# Patient Record
Sex: Female | Born: 1975 | Race: White | Hispanic: No | Marital: Married
Health system: Southern US, Community
[De-identification: ages and names within clinical notes are randomized; demographics above are authoritative.]

---

## 2013-10-05 ENCOUNTER — Emergency Department (HOSPITAL_BASED_OUTPATIENT_CLINIC_OR_DEPARTMENT_OTHER): Payer: BC Managed Care – PPO

## 2013-10-05 ENCOUNTER — Emergency Department (HOSPITAL_BASED_OUTPATIENT_CLINIC_OR_DEPARTMENT_OTHER)
Admission: EM | Admit: 2013-10-05 | Discharge: 2013-10-05 | Disposition: A | Discharge status: Home / Self Care | Payer: BC Managed Care – PPO | Attending: Emergency Medicine | Admitting: Emergency Medicine

## 2013-10-05 DIAGNOSIS — R0789 Other chest pain: Secondary | ICD-10-CM | POA: Insufficient documentation

## 2013-10-05 DIAGNOSIS — I10 Essential (primary) hypertension: Secondary | ICD-10-CM | POA: Insufficient documentation

## 2013-10-05 DIAGNOSIS — R002 Palpitations: Secondary | ICD-10-CM | POA: Insufficient documentation

## 2013-10-05 DIAGNOSIS — R079 Chest pain, unspecified: Secondary | ICD-10-CM

## 2013-10-05 LAB — CBC WITH DIFFERENTIAL/PLATELET
BASOS ABS: 0 10*3/uL (ref 0.0–0.1)
BASOS PCT: 1 % (ref 0–1)
Eosinophils Absolute: 0.2 10*3/uL (ref 0.0–0.7)
Eosinophils Relative: 2 % (ref 0–5)
HCT: 41.9 % (ref 36.0–46.0)
HEMOGLOBIN: 14.1 g/dL (ref 12.0–15.0)
Lymphocytes Relative: 30 % (ref 12–46)
Lymphs Abs: 2.6 10*3/uL (ref 0.7–4.0)
MCH: 29.9 pg (ref 26.0–34.0)
MCHC: 33.7 g/dL (ref 30.0–36.0)
MCV: 89 fL (ref 78.0–100.0)
MONOS PCT: 9 % (ref 3–12)
Monocytes Absolute: 0.8 10*3/uL (ref 0.1–1.0)
NEUTROS ABS: 5 10*3/uL (ref 1.7–7.7)
Neutrophils Relative %: 58 % (ref 43–77)
Platelets: 250 10*3/uL (ref 150–400)
RBC: 4.71 MIL/uL (ref 3.87–5.11)
RDW: 13.6 % (ref 11.5–15.5)
WBC: 8.5 10*3/uL (ref 4.0–10.5)

## 2013-10-05 LAB — COMPREHENSIVE METABOLIC PANEL
ALBUMIN: 3.7 g/dL (ref 3.5–5.2)
ALK PHOS: 65 U/L (ref 39–117)
ALT: 16 U/L (ref 0–35)
AST: 16 U/L (ref 0–37)
BUN: 11 mg/dL (ref 6–23)
CHLORIDE: 104 meq/L (ref 96–112)
CO2: 26 mEq/L (ref 19–32)
Calcium: 8.9 mg/dL (ref 8.4–10.5)
Creatinine, Ser: 0.8 mg/dL (ref 0.50–1.10)
GFR calc Af Amer: >90 mL/min (ref >90)
GFR calc non Af Amer: >90 mL/min (ref >90)
Glucose, Bld: 85 mg/dL (ref 70–99)
Potassium: 4 mEq/L (ref 3.7–5.3)
SODIUM: 141 meq/L (ref 137–147)
TOTAL PROTEIN: 7 g/dL (ref 6.0–8.3)
Total Bilirubin: 0.3 mg/dL (ref 0.3–1.2)

## 2013-10-05 LAB — LIPASE, BLOOD: Lipase: 27 U/L (ref 11–59)

## 2013-10-05 LAB — D-DIMER, QUANTITATIVE (NOT AT ARMC): D DIMER QUANT: 0.32 ug{FEU}/mL (ref 0.00–0.48)

## 2013-10-05 LAB — TROPONIN I: Troponin I: <0.3 ng/mL (ref <0.30)

## 2013-10-05 NOTE — ED Notes (Signed)
She states she had an episode of tightness in her left chest and rapid heart beat last night. After she calmed down and her heart rate was back to normal she took her BP and it was 180/129. Today she feels tired and like something is just not right.

## 2013-10-05 NOTE — ED Provider Notes (Signed)
CSN: [id number]     Arrival date & time 10/05/13  1046 History   First MD Initiated Contact with Patient 10/05/13 1142     Chief Complaint  Patient presents with  . Hypertension     (Consider location/radiation/quality/duration/timing/severity/associated sxs/prior Treatment) Patient is a 38 y.o. female presenting with hypertension. The history is provided by the patient.  Hypertension Associated symptoms include chest pain and shortness of breath. Pertinent negatives include no abdominal pain and no headaches.   patient with onset of rapid heart rate chest discomfort lasted 20 minutes. Occurred last night. No symptoms since. Patient checked her blood pressure at home and she got 180/129. Patient without history of hypertension. With the palpitation to light with rapid heart rate. The chest pain was in the substernal area it was a tightness feeling nonradiating. As stated associated with shortness of breath no nausea no vomiting. No history of similar problems. No known cardiac history. Past medical history is noncontributory. Patient completely asymptomatic now.  No past medical history on file. No past surgical history on file. No family history on file. History  Substance Use Topics  . Smoking status: Not on file  . Smokeless tobacco: Not on file  . Alcohol Use: Not on file   OB History   No data available     Review of Systems  Constitutional: Negative for fever and fatigue.  HENT: Negative for congestion.   Eyes: Negative for redness.  Respiratory: Positive for shortness of breath.   Cardiovascular: Positive for chest pain and palpitations.  Gastrointestinal: Negative for abdominal pain.  Genitourinary: Negative for dysuria.  Musculoskeletal: Negative for back pain.  Neurological: Negative for headaches.  Hematological: Does not bruise/bleed easily.  Psychiatric/Behavioral: Negative for confusion.      Allergies  Review of patient's allergies indicates no known  allergies.  Home Medications  No current outpatient prescriptions on file. BP 128/97  Pulse 66  Temp(Src) 98.7 F (37.1 C) (Oral)  Resp 18  Ht 5\' 8"  (1.727 m)  Wt 300 lb (136.079 kg)  BMI 45.63 kg/m2  SpO2 100%  LMP 09/20/2013 Physical Exam  Nursing note and vitals reviewed. Constitutional: She is oriented to person, place, and time. She appears well-developed and well-nourished. No distress.  HENT:  Head: Normocephalic and atraumatic.  Mouth/Throat: Oropharynx is clear and moist.  Eyes: Conjunctivae and EOM are normal. Pupils are equal, round, and reactive to light.  Neck: Normal range of motion.  Cardiovascular: Normal rate, regular rhythm and intact distal pulses.   Pulmonary/Chest: Effort normal and breath sounds normal. No respiratory distress.  Abdominal: Soft. Bowel sounds are normal. There is no tenderness.  Musculoskeletal: Normal range of motion. She exhibits no edema.  Neurological: She is alert and oriented to person, place, and time. No cranial nerve deficit. She exhibits normal muscle tone. Coordination normal.  Skin: No rash noted.    ED Course  Procedures (including critical care time) Labs Review Labs Reviewed  D-DIMER, QUANTITATIVE  CBC WITH DIFFERENTIAL  COMPREHENSIVE METABOLIC PANEL  LIPASE, BLOOD  TROPONIN I   Imaging Review Dg Chest 2 View  10/05/2013   CLINICAL DATA:  Hypertension and chest tightness  EXAM: CHEST  2 VIEW  COMPARISON:  None.  FINDINGS: Lungs are clear. Heart size and pulmonary vascularity are normal. No adenopathy. No pneumothorax. No bone lesions.  IMPRESSION: No abnormality noted.   Electronically Signed   By: Bretta BangWilliam  Woodruff M.D.   On: 10/05/2013 12:35    EKG Interpretation    Date/Time:  Tuesday October 05 2013 11:03:35 EST Ventricular Rate:  75 PR Interval:  158 QRS Duration: 86 QT Interval:  386 QTC Calculation: 431 R Axis:   7 Text Interpretation:  Normal sinus rhythm Cannot rule out Anterior infarct , age  undetermined Abnormal ECG No previous ECGs available Confirmed by Jamile Rekowski  MD, Lucrezia Dehne ([id number]) on 10/05/2013 11:45:58 AM            MDM   Final diagnoses:  Heart palpitations  Chest pain    Patient's workup here is negative. EKG without any acute cardiac findings. Troponin was negative. Patient's period of chest pain was last night the troponin being negative rules out acute cardiac event. The pain lasted for 20 minutes. Palpitations for 20 minutes 2. Patient's blood pressure here is been fine. No evidence of hypertension. Patient will be referred to cardiology for consideration for additional workup for the palpitations and the chest pain. We'll start on aspirin. Today's workup also negative for pneumonia pneumothorax pulmonary edema. D-dimer is negative not consistent with pulmonary embolus. And troponin was negative along with an EKG without acute findings. Patient's pain completely asymptomatic here.    Shelda Jakes, MD 10/05/13 [phone]

## 2013-10-05 NOTE — Discharge Instructions (Signed)
Aspirin and Your Heart Aspirin affects the way your blood clots and helps "thin" the blood. Aspirin has many uses in heart disease. It may be used as a primary prevention to help reduce the risk of heart related events. It also can be used as a secondary measure to prevent more heart attacks or to prevent additional damage from blood clots.  ASPIRIN MAY HELP IF YOU:  Have had a heart attack or chest pain.  Have undergone open heart surgery such as CABG (Coronary Artery Bypass Surgery).  Have had coronary angioplasty with or without stents.  Have experienced a stroke or TIA (transient ischemic attack).  Have peripheral vascular disease (PAD).  Have chronic heart rhythm problems such as atrial fibrillation.  Are at risk for heart disease. BEFORE STARTING ASPIRIN Before you start taking aspirin, your caregiver will need to review your medical history. Many things will need to be taken into consideration, such as:  Smoking status.  Blood pressure.  Diabetes.  Gender.  Weight.  Cholesterol level. ASPIRIN DOSES  Aspirin should only be taken on the advice of your caregiver. Talk to your caregiver about how much aspirin you should take. Aspirin comes in different doses such as:  81 mg.  162 mg.  325 mg.  The aspirin dose you take may be affected by many factors, some of which include:  Your current medications, especially if your are taking blood-thinners or anti-platelet medicine.  Liver function.  Heart disease risk.  Age.  Aspirin comes in two forms:  Non-enteric-coated. This type of aspirin does not have a coating and is absorbed faster. Non-enteric coated aspirin is recommended for patients experiencing chest pain symptoms. This type of aspirin also comes in a chewable form.  Enteric-coated. This means the aspirin has a special coating that releases the medicine very slowly. Enteric-coated aspirin causes less stomach upset. This type of aspirin should not be chewed  or crushed. ASPIRIN SIDE EFFECTS Daily use of aspirin can increase your risk of serious side effects. Some of these include:  Increased bleeding. This can range from a cut that does not stop bleeding to more serious problems such as stomach bleeding or bleeding into the brain (Intracerebral bleeding).  Increased bruising.  Stomach upset.  An allergic reaction such as red, itchy skin.  Increased risk of bleeding when combined with non-steroidal anti-inflammatory medicine (NSAIDS).  Alcohol should be drank in moderation when taking aspirin. Alcohol can increase the risk of stomach bleeding when taken with aspirin.  Aspirin should not be given to children less than 29 years of age due to the association of Reye syndrome. Reye syndrome is a serious illness that can affect the brain and liver. Studies have linked Reye syndrome with aspirin use in children.  People that have nasal polyps have an increased risk of developing an aspirin allergy. SEEK MEDICAL CARE IF:   You develop an allergic reaction such as:  Hives.  Itchy skin.  Swelling of the lips, tongue or face.  You develop stomach pain.  You have unusual bleeding or bruising.  You have ringing in your ears. SEEK IMMEDIATE MEDICAL CARE IF:   You have severe chest pain, especially if the pain is crushing or pressure-like and spreads to the arms, back, neck, or jaw. THIS IS AN EMERGENCY. Do not wait to see if the pain will go away. Get medical help at once. Call your local emergency services (911 in the U.S.). DO NOT drive yourself to the hospital.  You have stroke-like symptoms  such as:  Loss of vision.  Difficulty talking.  Numbness or weakness on one side of your body.  Numbness or weakness in your arm or leg.  Not thinking clearly or feeling confused.  Your bowel movements are bloody, dark red or black in color.  You vomit or cough up blood.  You have blood in your urine.  You have shortness of breath,  coughing or wheezing. MAKE SURE YOU:   Understand these instructions.  Will monitor your condition.  Seek immediate medical care if necessary. Document Released: 07/18/2008 Document Revised: 11/30/2012 Document Reviewed: 07/18/2008 ExitCare Patient Information 2014 Marion Downer.    call cardiology for followup. Number is [phone].  Start a baby aspirin a day. Return for recurrent rapid heart rate lasting 40 minutes or longer. Return for any new or worse chest pain. Resource guide provided below to help you find a regular doctor.    Emergency Department Resource Guide 1) Find a Doctor and Pay Out of Pocket Although you won't have to find out who is covered by your insurance plan, it is a good idea to ask around and get recommendations. You will then need to call the office and see if the doctor you have chosen will accept you as a new patient and what types of options they offer for patients who are self-pay. Some doctors offer discounts or will set up payment plans for their patients who do not have insurance, but you will need to ask so you aren't surprised when you get to your appointment.  2) Contact Your Local Health Department Not all health departments have doctors that can see patients for sick visits, but many do, so it is worth a call to see if yours does. If you don't know where your local health department is, you can check in your phone book. The CDC also has a tool to help you locate your state's health department, and many state websites also have listings of all of their local health departments.  3) Find a Walk-in Clinic If your illness is not likely to be very severe or complicated, you may want to try a walk in clinic. These are popping up all over the country in pharmacies, drugstores, and shopping centers. They're usually staffed by nurse practitioners or physician assistants that have been trained to treat common illnesses and complaints. They're usually fairly quick  and inexpensive. However, if you have serious medical issues or chronic medical problems, these are probably not your best option.  No Primary Care Doctor: - Call Health Connect at  [phone] - they can help you locate a primary care doctor that  accepts your insurance, provides certain services, etc. - Physician Referral Service- [phone]  Chronic Pain Problems: Organization         Address  Phone   Notes  [facility]  [phone] Patients need to be referred by their primary care doctor.   Medication Assistance: Organization         Address  Phone   Notes  [facility] Medication [facility] [address] [place], [place] [address] [phone] --Must be a resident of [facility] -- Must have NO insurance coverage whatsoever (no Medicaid/ Medicare, etc.) -- The pt. MUST have a primary care doctor that directs their care regularly and follows them in the community   Med[facility]ist  [phone]   [employer]  [phone]    Agencies that provide inexpensive medical care: Organization  Address  Phone   Notes  [place]  [phone]   [facility]    [phone]   [facility] [place], [place] [address] [phone]   [place] [address] [place]. [address], [place] [phone]   Planned Parenthood    [phone]   [facility]    [phone]   [place] [address], [place] Phone:  [phone], Fax:  [phone] Hours of Operation:  9 am - 6 pm, M-F.  Also accepts Medicaid/Medicare and self-pay.  [facility] for [place] [address], [address], [place] Phone: [phone], Fax: [phone]. Hours of Operation:  8:30 am - 5:30 pm, M-F.  Also accepts Medicaid and self-pay.  [facility] [place] [address] [place] Phone: [phone]    [place], [place], [place] [phone], Ext. [phone] Mondays & Thursdays: 7-9 AM.  First 15 patients are seen on a first come, first serve basis.    [place] Providers:  Organization         Address  Phone   Notes  [facility] [address], [place] [phone] Also accepts self-pay patients.  [facility] [address], [place]  [phone]   [place], [address], [place] [phone]   [facility] [address] [place] [phone]   Lucianne Lei [address], [place]   [phone] Only accepts [place] Access [place] patients after they have their name applied to their card.   Self-Pay (no insurance) in [facility]:  Organization         Address  Phone   Notes  Sickle Cell Patients, [facility] [place] [phone]   [facility] [place] [phone]   [facility] [place]  [place], [place], [place] [phone]   [facility]/Dr. Osei-Bonsu  [address] [place], [place] or [address], [place] [phone] Phone number for both [place] and [place] locations is the same.  [facility] [address] [place] [phone]   [facility] [address], [place] or [address] [phone]   [facility] [address], [place] [phone], phone; [phone], fax Sees patients 1st and 3rd Saturday of every month.  Must not qualify for public or private insurance (i.e. Medicaid, Medicare, [place] Health Choice, Veterans' Benefits)  Household income should be no more than 200% of the poverty level The clinic cannot treat you if you are  pregnant or think you are pregnant  Sexually transmitted diseases are not treated at the clinic.    Dental Care: Organization         Address  Phone  Notes  [facility] Department of [facility] [place] [phone] Accepts children up to age 41 who are enrolled in [place] or [place]; pregnant women with a Medicaid card; and children who have applied for Medicaid or [place] Health Choice, but were declined, whose parents can pay a reduced fee at time  of service.  [facility] Department of [facility]  [address], [place] [phone] Accepts children up to age 26 who are enrolled in [place] or [place]; pregnant women with a Medicaid card; and children who have applied for Medicaid or [place] Health Choice, but were declined, whose parents can pay a reduced fee at time of service.  [facility]  [place] [phone] Patients are seen by appointment only. Walk-ins are not accepted. [place] will see patients 53 years of age and older. Monday - Tuesday (8am-5pm) Most Wednesdays (8:30-5pm) $30 per visit, cash only  [facility] Adult Dental Access PROGRAM  [address], [facility] [phone] Patients are seen by appointment only. Walk-ins are not accepted. [place] will see patients 87 years of age and older. One Wednesday Evening (Monthly: Volunteer Based).  $30 per visit, cash only  [place]  [phone] for adults; Children under age 29, call Graduate Pediatric Dentistry at [phone]. Children aged 22-14, please call [phone] to request a pediatric application.  Dental services are provided in all areas of dental care including fillings, crowns and bridges, complete and partial dentures, implants, gum treatment, root canals, and extractions. Preventive care is also provided. Treatment is provided to  both adults and children. Patients are selected via a lottery and there is often a waiting list.   [facility] [address] [place]  [phone] [email or web address]   [facility] [address] [place], [place] [phone], Ext. [phone] Second and Fourth Thursday of each month, opens at 6:30 AM; Clinic ends at 9 AM.  Patients are seen on a first-come first-served basis, and a limited number are seen during each clinic.   [facility]  [address] Hillard Danker [place], [place] [phone]   Eligibility Requirements You must have lived in [place], [place], or [place] counties for at least the last three months.   You cannot be eligible for state or federal sponsored [employer], including [employer], [place], or [employer].   You generally cannot be eligible for healthcare insurance through your employer.    How to apply: Eligibility screenings are held every Tuesday and Wednesday afternoon from 1:00 pm until 4:00 pm. You do not need an appointment for the interview!  [facility] [address] [place], [place]   [place]  [place] Department  [place]  [phone]    Behavioral Health Resources in the Community: Intensive Outpatient Programs Organization         Address  Phone  Notes  [place]. [address] [place], [place] [phone]   [facility] Outpatient [address] [place], [place]   ADS: Alcohol & Drug Svcs [address], [place], [place]   [place] [address],  [place], [place] or [phone]   Substance Abuse Resources Organization         Address  Phone  Notes  Alcohol and Drug Services  [phone]   [place]  [phone]   [facility] [place]   Chinita Pester  [phone]   Residential & Outpatient Substance Abuse Program  [phone]   Psychological Services Organization         Address  Phone  Notes  [facility]  [phone]- [phone]   [facility]  [phone]- [phone]   [facility] [address], [place] [phone] or [phone]    Mobile Crisis Teams Organization         Address  Phone  Notes  [facility], Mobile Crisis Care Unit  [phone]   [facility]  [address] [place], [place] [phone]   Doristine LocksSharon DeEsch [address] [place] [phone]    Self-Help/Support Groups Organization         Address  Phone             Notes  [facility] of [place] - variety of support groups  336- [phone] Call for more information  Narcotics Anonymous (NA), [facility] [address], [place]  2 meetings at this location   Statisticianesidential Treatment Programs Organization         Address  Phone  Notes  [facility] [address],    [place]  [phone]   [facility]  [address], [place] [phone], [place], [place] [phone]   [facility] [address], [place] [phone] Admissions: 8am-3pm M-F  [facility] [address]    [place], [place] [phone]   [facility] [address], [place], [place] [phone]   [facility] [address]  [place], [place] [phone]   [facility] - Intensive Outpatient [address], [place], [place] [phone]   [facility] [address]  [place], [place] [phone] or [phone]   [facility] ([facility]) [address] [place], [place] [phone] Accepts Medicaid  [facility] [place] [address]  [place] [phone] Substance Abuse/Addiction Treatment   [facility] Behavioral Health Resources Organization         Address  Phone  Notes  [facility]  [phone]   Angie FavaJulie Brannon, PhD [address], [address] [place], [place]   [phone]) [phone] or [phone]) [phone]   [facility]   [address] [place], [place] [phone]   [facility] [address], [place], [place] [phone] Insurance/Medicaid/sponsorship through [facility]  Faith and Families [address]                                    [place], [place] [phone]) [phone] Therapy/tele-psych/case  [facility] [address] [place] [address].   [place], [place] [phone]) [phone]    Dr. Lolly MustacheArfeen  [phone]   [facility]  [employer] [facility] 1) [address], [place] 2) [address], [place] 3)  [address], [place] [phone](336) [phone]  [phone]   [facility] Child Abuse Hotline [phone](336) [phone] or [phone](336) [phone] (After Hours)

## 2015-06-10 IMAGING — CR DG CHEST 2V
2 series · 2 of 2 positions shown · non-contrast
Comparison: None.

CLINICAL DATA: Hypertension and chest tightness

EXAM:
CHEST  2 VIEW

[w chest pa]
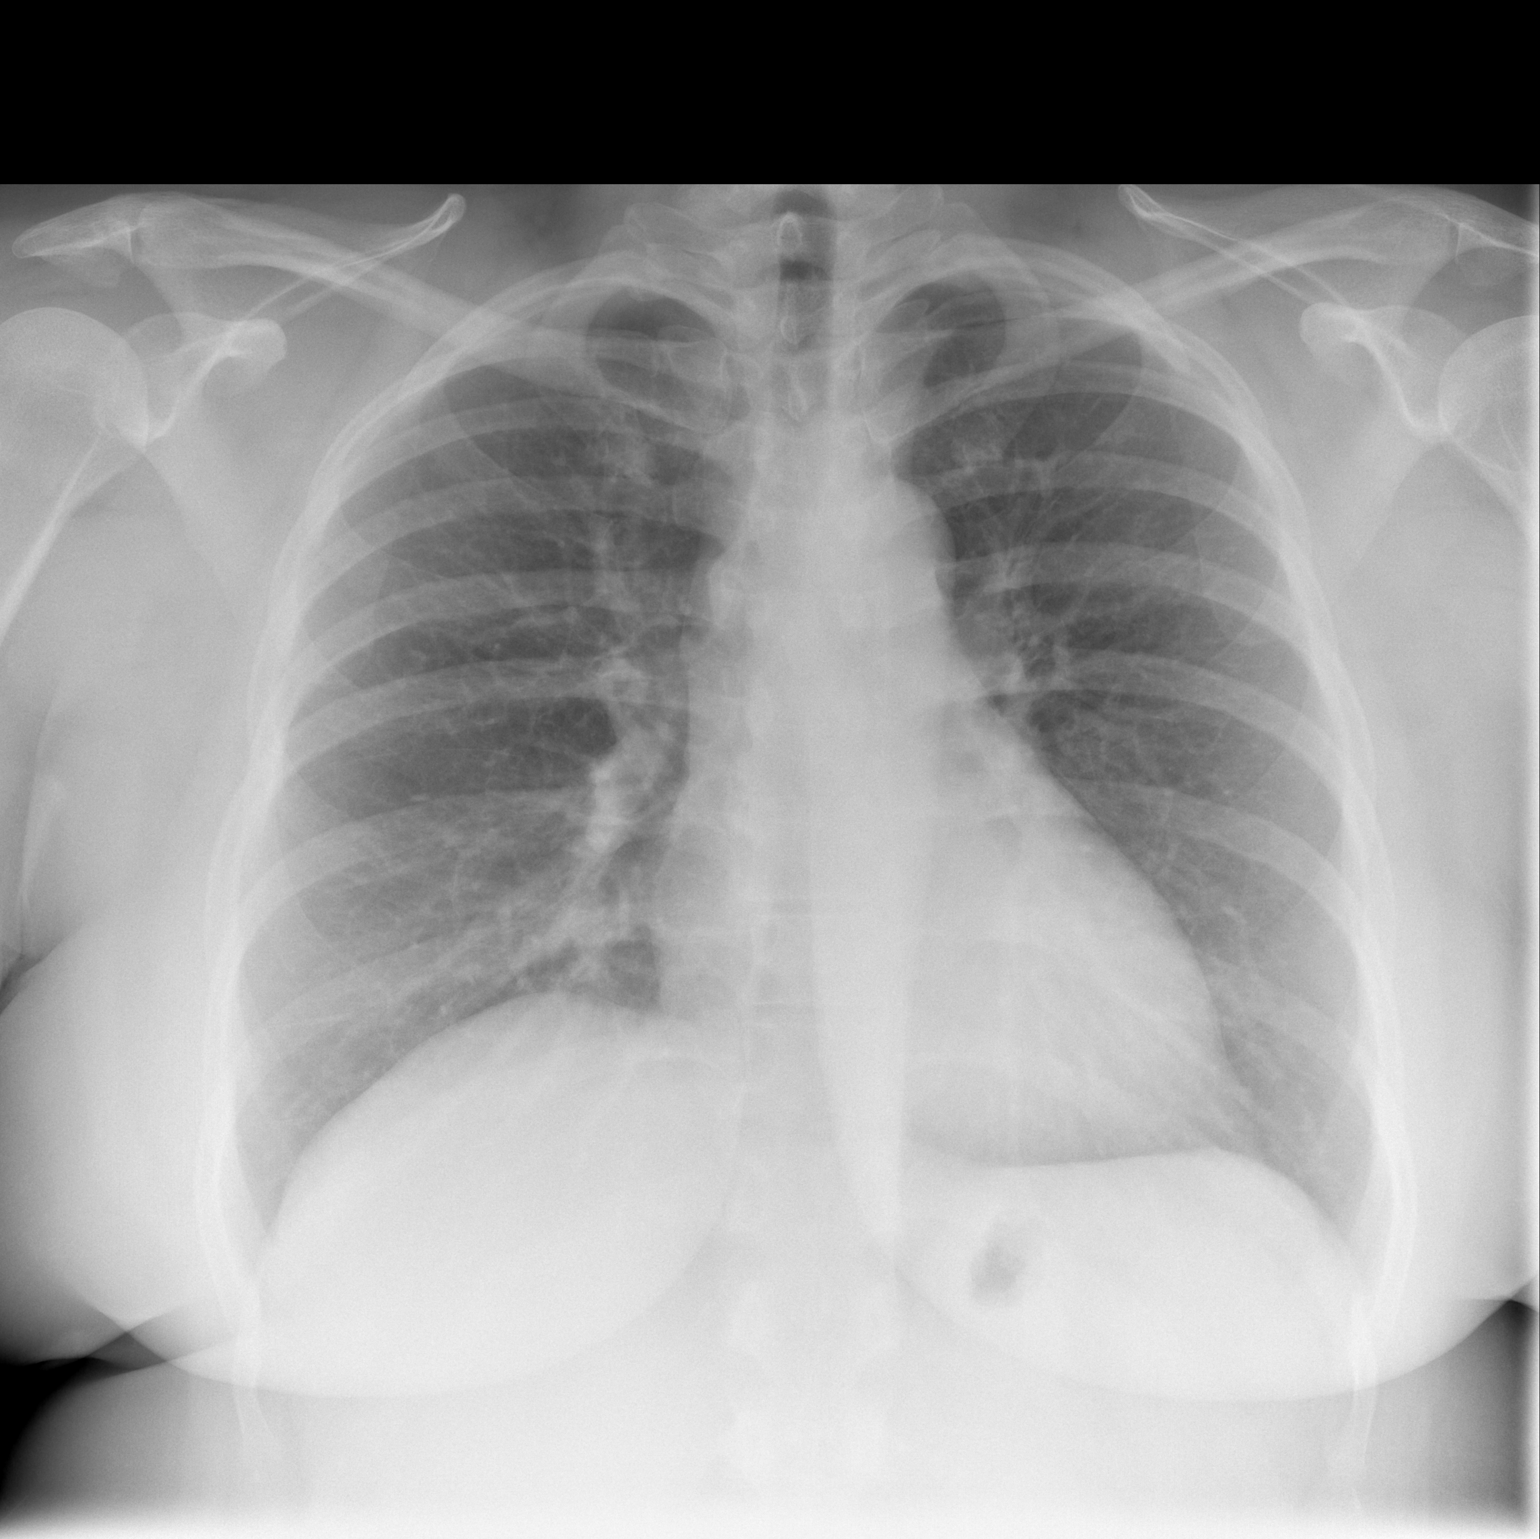

[w chest lat]
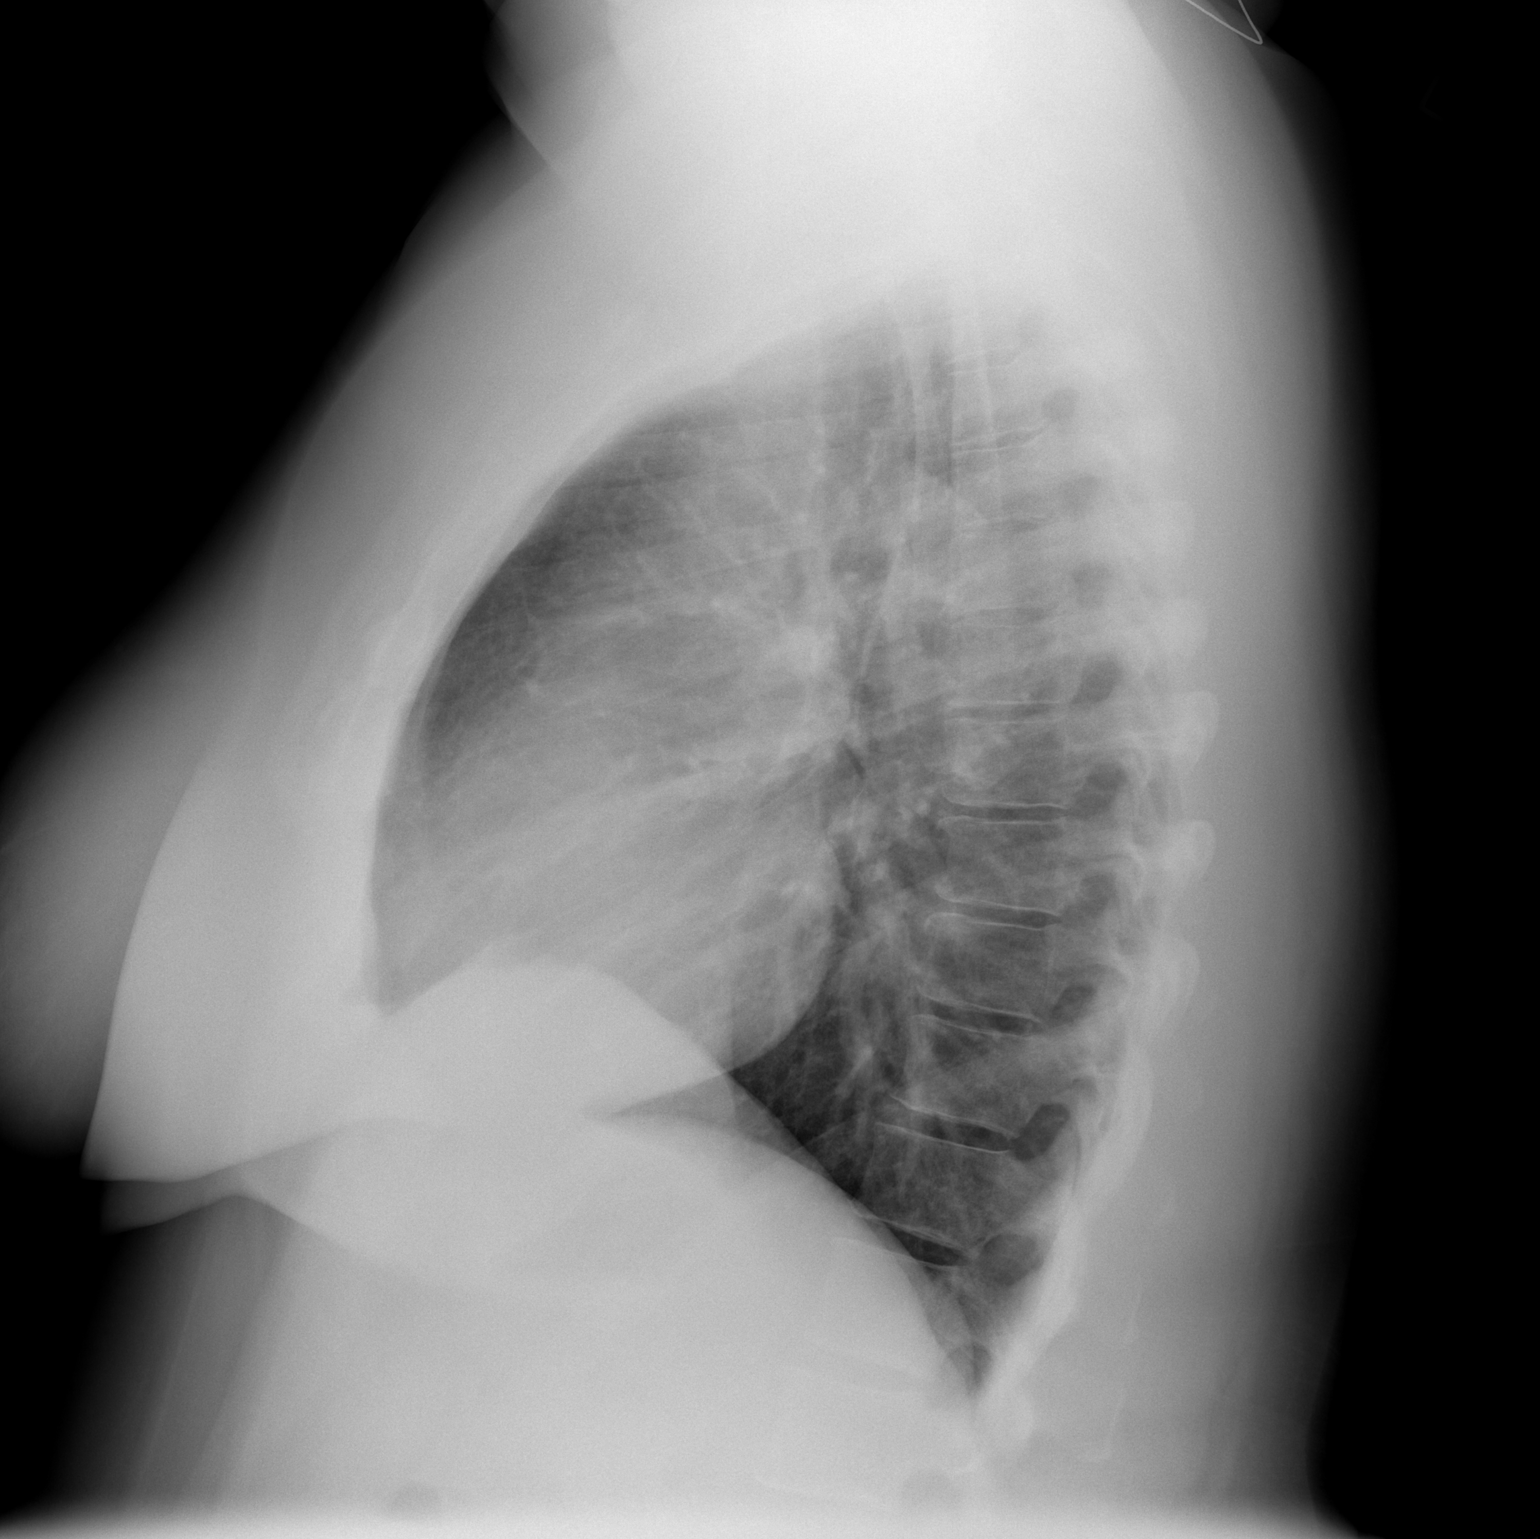

[2 of 2 positions shown; findings below may reference images not displayed]

FINDINGS: Lungs are clear. Heart size and pulmonary vascularity are normal. No
adenopathy. No pneumothorax. No bone lesions.
IMPRESSION: No abnormality noted.
